# Patient Record
Sex: Female | Born: 1935 | Smoking: Never smoker
Health system: Southern US, Community
[De-identification: ages and names within clinical notes are randomized; demographics above are authoritative.]

## PROBLEM LIST (undated history)

## (undated) DIAGNOSIS — E7800 Pure hypercholesterolemia, unspecified: Secondary | ICD-10-CM

## (undated) DIAGNOSIS — J3089 Other allergic rhinitis: Secondary | ICD-10-CM

## (undated) HISTORY — PX: WRIST FRACTURE SURGERY: SHX121

## (undated) HISTORY — PX: CATARACT EXTRACTION W/ INTRAOCULAR LENS IMPLANT: SHX1309

---

## 2018-01-09 ENCOUNTER — Encounter
Admission: RE | Admit: 2018-01-09 | Discharge: 2018-01-09 | Disposition: A | Discharge status: Home / Self Care | Payer: Medicare Other | Source: Ambulatory Visit | Attending: Orthopedic Surgery | Admitting: Orthopedic Surgery

## 2018-01-09 ENCOUNTER — Other Ambulatory Visit: Payer: Self-pay

## 2018-01-09 DIAGNOSIS — E785 Hyperlipidemia, unspecified: Secondary | ICD-10-CM | POA: Diagnosis not present

## 2018-01-09 DIAGNOSIS — Z881 Allergy status to other antibiotic agents status: Secondary | ICD-10-CM | POA: Diagnosis not present

## 2018-01-09 DIAGNOSIS — M4856XA Collapsed vertebra, not elsewhere classified, lumbar region, initial encounter for fracture: Secondary | ICD-10-CM | POA: Insufficient documentation

## 2018-01-09 DIAGNOSIS — Z01818 Encounter for other preprocedural examination: Secondary | ICD-10-CM

## 2018-01-09 DIAGNOSIS — M549 Dorsalgia, unspecified: Secondary | ICD-10-CM | POA: Diagnosis not present

## 2018-01-09 DIAGNOSIS — Z7982 Long term (current) use of aspirin: Secondary | ICD-10-CM | POA: Diagnosis not present

## 2018-01-09 DIAGNOSIS — R001 Bradycardia, unspecified: Secondary | ICD-10-CM | POA: Insufficient documentation

## 2018-01-09 DIAGNOSIS — Z79899 Other long term (current) drug therapy: Secondary | ICD-10-CM | POA: Diagnosis not present

## 2018-01-09 HISTORY — DX: Other allergic rhinitis: J30.89

## 2018-01-09 HISTORY — DX: Pure hypercholesterolemia, unspecified: E78.00

## 2018-01-09 LAB — CBC
HEMATOCRIT: 39.6 % (ref 35.0–47.0)
HEMOGLOBIN: 13.1 g/dL (ref 12.0–16.0)
MCH: 32.4 pg (ref 26.0–34.0)
MCHC: 33.1 g/dL (ref 32.0–36.0)
MCV: 98.2 fL (ref 80.0–100.0)
Platelets: 236 10*3/uL (ref 150–440)
RBC: 4.04 MIL/uL (ref 3.80–5.20)
RDW: 14.3 % (ref 11.5–14.5)
WBC: 5.3 10*3/uL (ref 3.6–11.0)

## 2018-01-09 MED ORDER — CEFAZOLIN SODIUM-DEXTROSE 2-4 GM/100ML-% IV SOLN: 2.0000 g | Freq: Once | INTRAVENOUS | Status: DC

## 2018-01-09 NOTE — Patient Instructions (Signed)
Your procedure is scheduled on: 01/10/18 Tues Report to Same Day Surgery 2nd floor medical mall [facility] Entrance-take elevator on left to 2nd floor.  Check in with surgery information desk.) @ 11:00 am   Remember: Instructions that are not followed completely may result in serious medical risk, up to and including death, or upon the discretion of your surgeon and anesthesiologist your surgery may need to be rescheduled.    _x___ 1. Do not eat food after midnight the night before your procedure. You may drink clear liquids up to 2 hours before you are scheduled to arrive at the hospital for your procedure.  Do not drink clear liquids within 2 hours of your scheduled arrival to the hospital.  Clear liquids include  --Water or Apple juice without pulp  --Clear carbohydrate beverage such as ClearFast or Gatorade  --Black Coffee or Clear Tea (No milk, no creamers, do not add anything to                  the coffee or Tea Type 1 and type 2 diabetics should only drink water.   ____Ensure clear carbohydrate drink on the way to the hospital for bariatric patients  ____Ensure clear carbohydrate drink 3 hours before surgery for Dr Rutherford Nail patients if physician instructed.   No gum chewing or hard candies.     __x__ 2. No Alcohol for 24 hours before or after surgery.   __x__3. No Smoking or e-cigarettes for 24 prior to surgery.  Do not use any chewable tobacco products for at least 6 hour prior to surgery   ____  4. Bring all medications with you on the day of surgery if instructed.    __x__ 5. Notify your doctor if there is any change in your medical condition     (cold, fever, infections).    x___6. On the morning of surgery brush your teeth with toothpaste and water.  You may rinse your mouth with mouth wash if you wish.  Do not swallow any toothpaste or mouthwash.   Do not wear jewelry, make-up, hairpins, clips or nail polish.  Do not wear lotions, powders, or perfumes. You may wear  deodorant.  Do not shave 48 hours prior to surgery. Men may shave face and neck.  Do not bring valuables to the hospital.    [facility] is not responsible for any belongings or valuables.               Contacts, dentures or bridgework may not be worn into surgery.  Leave your suitcase in the car. After surgery it may be brought to your room.  For patients admitted to the hospital, discharge time is determined by your                       treatment team.  _  Patients discharged the day of surgery will not be allowed to drive home.  You will need someone to drive you home and stay with you the night of your procedure.    Please read over the following fact sheets that you were given:   [facility] Preparing for Surgery and or MRSA Information   _x___ Take anti-hypertensive listed below, cardiac, seizure, asthma,     anti-reflux and psychiatric medicines. These include:  1. HYDROcodone-acetaminophen (NORCO/VICODIN) 5-325 MG tablet if needed  2.  3.  4.  5.  6.  ____Fleets enema or Magnesium Citrate as directed.   _x___ Use CHG Soap or  sage wipes as directed on instruction sheet   ____ Use inhalers on the day of surgery and bring to hospital day of surgery  ____ Stop Metformin and Janumet 2 days prior to surgery.    ____ Take 1/2 of usual insulin dose the night before surgery and none on the morning     surgery.   _x___ Follow recommendations from Cardiologist, Pulmonologist or PCP regarding          stopping Aspirin, Coumadin, Plavix ,Eliquis, Effient, or Pradaxa, and Pletal.  X____Stop Anti-inflammatories such as Advil, Aleve, Ibuprofen, Motrin, Naproxen, Naprosyn, Goodies powders or aspirin products. OK to take Tylenol and                          Celebrex.   _x___ Stop supplements until after surgery.  But may continue Vitamin D, Vitamin B,       and multivitamin.   ____ Bring C-Pap to the hospital.

## 2018-01-09 NOTE — Pre-Procedure Instructions (Signed)
Abnormal EKG, anesthesia RN notified.

## 2018-01-09 NOTE — Pre-Procedure Instructions (Signed)
EKG REVIEWED BY DR J ADAMS AND OK TO PROCEED 

## 2018-01-10 ENCOUNTER — Encounter
Admission: RE | Disposition: A | Discharge status: Home / Self Care | Payer: Self-pay | Source: Ambulatory Visit | Attending: Orthopedic Surgery

## 2018-01-10 ENCOUNTER — Other Ambulatory Visit: Payer: Self-pay

## 2018-01-10 ENCOUNTER — Encounter: Payer: Self-pay | Admitting: *Deleted

## 2018-01-10 ENCOUNTER — Ambulatory Visit
Admission: RE | Admit: 2018-01-10 | Discharge: 2018-01-10 | Disposition: A | Discharge status: Home / Self Care | Payer: Medicare Other | Source: Ambulatory Visit | Attending: Orthopedic Surgery | Admitting: Orthopedic Surgery

## 2018-01-10 ENCOUNTER — Ambulatory Visit: Payer: Medicare Other | Admitting: Anesthesiology

## 2018-01-10 ENCOUNTER — Ambulatory Visit: Payer: Medicare Other

## 2018-01-10 DIAGNOSIS — Z419 Encounter for procedure for purposes other than remedying health state, unspecified: Secondary | ICD-10-CM

## 2018-01-10 DIAGNOSIS — Z79899 Other long term (current) drug therapy: Secondary | ICD-10-CM | POA: Insufficient documentation

## 2018-01-10 DIAGNOSIS — M4856XA Collapsed vertebra, not elsewhere classified, lumbar region, initial encounter for fracture: Secondary | ICD-10-CM | POA: Diagnosis not present

## 2018-01-10 DIAGNOSIS — M549 Dorsalgia, unspecified: Secondary | ICD-10-CM | POA: Insufficient documentation

## 2018-01-10 DIAGNOSIS — E785 Hyperlipidemia, unspecified: Secondary | ICD-10-CM | POA: Insufficient documentation

## 2018-01-10 DIAGNOSIS — Z881 Allergy status to other antibiotic agents status: Secondary | ICD-10-CM | POA: Insufficient documentation

## 2018-01-10 DIAGNOSIS — Z7982 Long term (current) use of aspirin: Secondary | ICD-10-CM | POA: Insufficient documentation

## 2018-01-10 HISTORY — PX: KYPHOPLASTY: SHX5884

## 2018-01-10 SURGERY — KYPHOPLASTY
Anesthesia: General | Site: Back

## 2018-01-10 MED ORDER — CEFAZOLIN SODIUM-DEXTROSE 2-4 GM/100ML-% IV SOLN
INTRAVENOUS | Status: AC
  Filled 2018-01-10: qty 100

## 2018-01-10 MED ORDER — KETAMINE HCL 50 MG/ML IJ SOLN
INTRAMUSCULAR | Status: AC
  Filled 2018-01-10: qty 10

## 2018-01-10 MED ORDER — SODIUM CHLORIDE 0.9 % IV SOLN: INTRAVENOUS | Status: DC

## 2018-01-10 MED ORDER — ONDANSETRON HCL 4 MG PO TABS: 4.0000 mg | ORAL_TABLET | Freq: Four times a day (QID) | ORAL | Status: DC | PRN

## 2018-01-10 MED ORDER — OXYCODONE HCL 5 MG/5ML PO SOLN: 5.0000 mg | Freq: Once | ORAL | Status: DC | PRN

## 2018-01-10 MED ORDER — LIDOCAINE HCL 1 % IJ SOLN
INTRAMUSCULAR | Status: DC | PRN
  Administered 2018-01-10: 30 mL

## 2018-01-10 MED ORDER — HYDROCODONE-ACETAMINOPHEN 5-325 MG PO TABS: 1.0000 | ORAL_TABLET | Freq: Four times a day (QID) | ORAL | 0 refills | Status: AC | PRN

## 2018-01-10 MED ORDER — FENTANYL CITRATE (PF) 100 MCG/2ML IJ SOLN
25.0000 ug | INTRAMUSCULAR | Status: DC | PRN
  Administered 2018-01-10 (×2): 25 ug via INTRAVENOUS

## 2018-01-10 MED ORDER — KETAMINE HCL 50 MG/ML IJ SOLN
INTRAMUSCULAR | Status: DC | PRN
  Administered 2018-01-10: 5 mg via INTRAMUSCULAR
  Administered 2018-01-10: 20 mg via INTRAMUSCULAR

## 2018-01-10 MED ORDER — METOCLOPRAMIDE HCL 5 MG/ML IJ SOLN: 5.0000 mg | Freq: Three times a day (TID) | INTRAMUSCULAR | Status: DC | PRN

## 2018-01-10 MED ORDER — FAMOTIDINE 20 MG PO TABS
ORAL_TABLET | ORAL | Status: AC
  Administered 2018-01-10: 20 mg via ORAL
  Filled 2018-01-10: qty 1

## 2018-01-10 MED ORDER — ONDANSETRON HCL 4 MG/2ML IJ SOLN: 4.0000 mg | Freq: Once | INTRAMUSCULAR | Status: DC | PRN

## 2018-01-10 MED ORDER — MIDAZOLAM HCL 2 MG/2ML IJ SOLN
INTRAMUSCULAR | Status: AC
  Filled 2018-01-10: qty 2

## 2018-01-10 MED ORDER — ONDANSETRON HCL 4 MG/2ML IJ SOLN: 4.0000 mg | Freq: Four times a day (QID) | INTRAMUSCULAR | Status: DC | PRN

## 2018-01-10 MED ORDER — IOPAMIDOL (ISOVUE-M 200) INJECTION 41%
INTRAMUSCULAR | Status: DC | PRN
  Administered 2018-01-10: 20 mL

## 2018-01-10 MED ORDER — MEPERIDINE HCL 50 MG/ML IJ SOLN: 6.2500 mg | INTRAMUSCULAR | Status: DC | PRN

## 2018-01-10 MED ORDER — FAMOTIDINE 20 MG PO TABS
20.0000 mg | ORAL_TABLET | Freq: Once | ORAL | Status: AC
  Administered 2018-01-10: 20 mg via ORAL

## 2018-01-10 MED ORDER — PROPOFOL 10 MG/ML IV BOLUS
INTRAVENOUS | Status: AC
  Filled 2018-01-10: qty 20

## 2018-01-10 MED ORDER — OXYCODONE HCL 5 MG PO TABS: 5.0000 mg | ORAL_TABLET | Freq: Once | ORAL | Status: DC | PRN

## 2018-01-10 MED ORDER — BUPIVACAINE-EPINEPHRINE (PF) 0.5% -1:200000 IJ SOLN
INTRAMUSCULAR | Status: DC | PRN
  Administered 2018-01-10: 20 mL

## 2018-01-10 MED ORDER — FENTANYL CITRATE (PF) 100 MCG/2ML IJ SOLN
INTRAMUSCULAR | Status: AC
  Administered 2018-01-10: 25 ug via INTRAVENOUS
  Filled 2018-01-10: qty 2

## 2018-01-10 MED ORDER — METOCLOPRAMIDE HCL 10 MG PO TABS: 5.0000 mg | ORAL_TABLET | Freq: Three times a day (TID) | ORAL | Status: DC | PRN

## 2018-01-10 MED ORDER — HYDROCODONE-ACETAMINOPHEN 5-325 MG PO TABS: 1.0000 | ORAL_TABLET | ORAL | Status: DC | PRN

## 2018-01-10 MED ORDER — LACTATED RINGERS IV SOLN
INTRAVENOUS | Status: DC
  Administered 2018-01-10: 12:00:00 via INTRAVENOUS

## 2018-01-10 MED ORDER — MIDAZOLAM HCL 2 MG/2ML IJ SOLN
INTRAMUSCULAR | Status: DC | PRN
  Administered 2018-01-10 (×2): 0.5 mg via INTRAVENOUS

## 2018-01-10 SURGICAL SUPPLY — 18 items
CEMENT KYPHON CX01A KIT/MIXER (Cement) ×3 IMPLANT
DERMABOND ADVANCED (GAUZE/BANDAGES/DRESSINGS) ×2
DERMABOND ADVANCED .7 DNX12 (GAUZE/BANDAGES/DRESSINGS) ×1 IMPLANT
DEVICE BIOPSY BONE KYPH (INSTRUMENTS) ×2 IMPLANT
DEVICE BIOPSY BONE KYPHX (INSTRUMENTS) ×1 IMPLANT
DRAPE C-ARM XRAY 36X54 (DRAPES) ×3 IMPLANT
DURAPREP 26ML APPLICATOR (WOUND CARE) ×3 IMPLANT
GLOVE SURG SYN 9.0  PF PI (GLOVE) ×2
GLOVE SURG SYN 9.0 PF PI (GLOVE) ×1 IMPLANT
GOWN SRG 2XL LVL 4 RGLN SLV (GOWNS) ×1 IMPLANT
GOWN STRL NON-REIN 2XL LVL4 (GOWNS) ×2
GOWN STRL REUS W/ TWL LRG LVL3 (GOWN DISPOSABLE) ×1 IMPLANT
GOWN STRL REUS W/TWL LRG LVL3 (GOWN DISPOSABLE) ×2
PACK KYPHOPLASTY (MISCELLANEOUS) ×3 IMPLANT
STRAP SAFETY 5IN WIDE (MISCELLANEOUS) ×3 IMPLANT
TRAY KYPHOPAK 15/2 EXPRESS (KITS) ×2 IMPLANT
TRAY KYPHOPAK 15/3 EXPRESS 1ST (MISCELLANEOUS) ×1 IMPLANT
TRAY KYPHOPAK 20/3 EXPRESS 1ST (MISCELLANEOUS) ×1 IMPLANT

## 2018-01-10 NOTE — Anesthesia Post-op Follow-up Note (Signed)
Anesthesia QCDR form completed.        

## 2018-01-10 NOTE — Discharge Instructions (Addendum)
Take it easy today and tomorrow and resume more normal activities on Thursday as tolerated.  Remove Band-Aids on Thursday then okay to shower.  Pain medicine as directed    AMBULATORY SURGERY  DISCHARGE INSTRUCTIONS   1) The drugs that you were given will stay in your system until tomorrow so for the next 24 hours you should not:  A) Drive an automobile B) Make any legal decisions C) Drink any alcoholic beverage   2) You may resume regular meals tomorrow.  Today it is better to start with liquids and gradually work up to solid foods.  You may eat anything you prefer, but it is better to start with liquids, then soup and crackers, and gradually work up to solid foods.   3) Please notify your doctor immediately if you have any unusual bleeding, trouble breathing, redness and pain at the surgery site, drainage, fever, or pain not relieved by medication.    4) Additional Instructions:        Please contact your physician with any problems or Same Day Surgery at [phone], Monday through Friday 6 am to 4 pm, or [place] at [facility] number at [phone].

## 2018-01-10 NOTE — Anesthesia Postprocedure Evaluation (Signed)
Anesthesia Post Note  Patient: Carrie Rich  Procedure(s) Performed: Clovia Cuff (N/A Back)  Patient location during evaluation: PACU Anesthesia Type: General Level of consciousness: awake and alert and oriented Pain management: pain level controlled Vital Signs Assessment: post-procedure vital signs reviewed and stable Respiratory status: spontaneous breathing, nonlabored ventilation and respiratory function stable Cardiovascular status: blood pressure returned to baseline and stable Postop Assessment: no signs of nausea or vomiting Anesthetic complications: no     Last Vitals:  Vitals:   01/10/18 1332 01/10/18 1341  BP: 97/79 120/66  Pulse: 61   Resp: (!) 26   Temp:    SpO2: 100%     Last Pain:  Vitals:   01/10/18 1341  TempSrc:   PainSc: Asleep                 Kirstein Baxley

## 2018-01-10 NOTE — OR Nursing (Signed)
Discussed discharge instructions with son and grandaughter and pt. All voice understanding.

## 2018-01-10 NOTE — H&P (Signed)
Reviewed paper H+P, will be scanned into chart. No changes noted.  

## 2018-01-10 NOTE — Op Note (Signed)
01/10/2018  1:04 PM  PATIENT:  Carrie Rich  82 y.o. female  PRE-OPERATIVE DIAGNOSIS:  COMPRESSION FRACTURE OF L4 VERTEBRA  POST-OPERATIVE DIAGNOSIS:  COMPRESSION FRACTURE OF L4 VERTEBRA  PROCEDURE:  Procedure(s): KYPHOPLASTY-L4 (N/A)  SURGEON: Laurene Footman, MD  ASSISTANTS: None  ANESTHESIA:   local and MAC  EBL:  Total I/O In: 300 [I.V.:300] Out: 5 [Blood:5]  BLOOD ADMINISTERED:none  DRAINS: none   LOCAL MEDICATIONS USED:  MARCAINE    and XYLOCAINE   SPECIMEN:  Source of Specimen:  L4 vertebral body  DISPOSITION OF SPECIMEN:  PATHOLOGY  COUNTS:  YES  TOURNIQUET:  * No tourniquets in log *  IMPLANTS: Bone cement  DICTATION: .Dragon Dictation patient was brought to the operating room and after adequate sedation was given the patient was placed prone.  C arm was brought in in good visualization in both AP and lateral projections was obtained.  After patient identification and timeout procedures were completed 5 cc of 1% Xylocaine was infiltrated in the area of the planned incision on either side of L4.  After prepping and draping in the usual sterile fashion, repeat timeout procedure carried out.  Spinal needle was then brought to the lateral border of the pedicle at L4 and local anesthetic infiltrated from the tract from the bone to the skin with a 50-50 mix of 1% Xylocaine half percent Sensorcaine with epinephrine 10 cc of each on each side.  After allowing this to set a small incision was made first on the right than the left with the trocar advanced along the inferior border of the pedicle to stay closer to the inferior endplate and avoid penetration of the superior endplate.  After biopsy was obtained drilling was carried out followed by balloon inflation first on the right and then left with about 7 cc utilized.  After the cement was the appropriate consistency the balloon on the right was let down and inflation left up on the left side to maintain the partial correction  of the deformity.  After filling the right side and getting some of the cement to crossing the midline the left balloon was released and both sides were filled with about 7-1/2 cc of cement placed maintaining partial correction of the deformity and getting very good fill without extravasation.  After the cement had set both trochars were removed and Dermabond was used for the skin incisions followed by Band-Aids  PLAN OF CARE: Discharge to home after PACU  PATIENT DISPOSITION:  PACU - hemodynamically stable.

## 2018-01-10 NOTE — Transfer of Care (Signed)
Immediate Anesthesia Transfer of Care Note  Patient: Carrie Rich  Procedure(s) Performed: Clovia Cuff (N/A Back)  Patient Location: PACU  Anesthesia Type:General  Level of Consciousness: awake, alert  and oriented  Airway & Oxygen Therapy: Patient Spontanous Breathing  Post-op Assessment: Report given to RN and Post -op Vital signs reviewed and stable  Post vital signs: Reviewed and stable  Last Vitals:  Vitals Value Taken Time  BP 140/71 01/10/2018  1:02 PM  Temp 36.2 C 01/10/2018  1:00 PM  Pulse 65 01/10/2018  1:02 PM  Resp 12 01/10/2018  1:02 PM  SpO2 100 % 01/10/2018  1:02 PM  Vitals shown include unvalidated device data.  Last Pain:  Vitals:   01/10/18 1054  TempSrc: Oral  PainSc: 5          Complications: No apparent anesthesia complications

## 2018-01-10 NOTE — Anesthesia Preprocedure Evaluation (Signed)
Anesthesia Evaluation  Patient identified by MRN, date of birth, ID band Patient awake    Reviewed: Allergy & Precautions, NPO status , Patient's Chart, lab work & pertinent test results  History of Anesthesia Complications Negative for: history of anesthetic complications  Airway Mallampati: II  TM Distance: >3 FB Neck ROM: Full    Dental  (+) Upper Dentures, Partial Lower   Pulmonary neg pulmonary ROS, neg sleep apnea, neg COPD,    breath sounds clear to auscultation- rhonchi (-) wheezing      Cardiovascular Exercise Tolerance: Good (-) hypertension(-) CAD and (-) Past MI  Rhythm:Regular Rate:Normal - Systolic murmurs and - Diastolic murmurs    Neuro/Psych negative neurological ROS  negative psych ROS   GI/Hepatic negative GI ROS, Neg liver ROS,   Endo/Other  negative endocrine ROSneg diabetes  Renal/GU negative Renal ROS     Musculoskeletal negative musculoskeletal ROS (+)   Abdominal (+) - obese,   Peds  Hematology negative hematology ROS (+)   Anesthesia Other Findings Past Medical History: No date: Elevated cholesterol No date: Environmental and seasonal allergies   Reproductive/Obstetrics                             Anesthesia Physical Anesthesia Plan  ASA: II  Anesthesia Plan: General   Post-op Pain Management:    Induction: Intravenous  PONV Risk Score and Plan: 2 and Propofol infusion  Airway Management Planned: Natural Airway  Additional Equipment:   Intra-op Plan:   Post-operative Plan:   Informed Consent: I have reviewed the patients History and Physical, chart, labs and discussed the procedure including the risks, benefits and alternatives for the proposed anesthesia with the patient or authorized representative who has indicated his/her understanding and acceptance.   Dental advisory given  Plan Discussed with: CRNA and Anesthesiologist  Anesthesia Plan  Comments:         Anesthesia Quick Evaluation

## 2018-01-11 ENCOUNTER — Encounter: Payer: Self-pay | Admitting: Orthopedic Surgery

## 2018-01-11 LAB — SURGICAL PATHOLOGY

## 2018-01-23 NOTE — H&P (Signed)
Progress Notes - documented in this encounter  Table of Contents for Progress Notes  Marlena Clipper, MD - 01/04/2018 12:00 PM EDT  Kelvin Cellar, CMA - 01/04/2018 12:00 PM EDT    Marlena Clipper, MD - 01/04/2018 12:00 PM EDT Formatting of this note might be different from the original.  Chief Complaint  Patient presents with  . Back Pain - Lumbar Area  L4 Compression Fracture.   History of the Present Illness: Carrie Rich is a 82 y.o. female here for evaluation of an L4 compression fracture. The patient had a prior MRI at [facility] on 11/26/2017. This showed extensive edema of L4 with significant compression of the superior endplate.   The patient reports that she is not sure of what caused her fracture. She explains she had difficulty standing up straight one morning due to pain. She rates her current pain at 5/10. She has more pain with walking. She has reduced her activities due to pain.   She has history of a prior compression fracture at T10. She has been followed by Dr. Lois Huxley in the past concerning her back.  I have reviewed past medical, surgical, social and family history, and allergies as documented in the EMR.  Past Medical History: Past Medical History:  Diagnosis Date  . Bigeminy  . Hyperlipidemia  . Osteoporosis   Past Surgical History: Past Surgical History:  Procedure Laterality Date  . COLONOSCOPY  . pr remv cataract extracap,insert lens (Left, 07/29/2014); and pr remv cataract extracap,insert lens (Right, 08/12/2014)  . Wrist fracture surgery (Right)   Past Family History: Family History  Problem Relation Age of Onset  . Macular degeneration Mother  . Atrial fibrillation (Abnormal heart rhythm sometimes requiring treatment with blood thinners) Sister  . Glaucoma Sister  . Osteoporosis (Thinning of bones) Sister  . Macular degeneration Brother  . Breast cancer Paternal Aunt   Medications: Current Outpatient Medications Ordered in Epic    Medication Sig Dispense Refill  . alendronate (FOSAMAX) 70 MG tablet Take 1 tablet by mouth every 7 (seven) days  . aspirin 81 MG EC tablet Take 1 tablet by mouth once daily  . biotin 1 mg tablet Take 1 mg by mouth once daily  . cal-D3-mag11-zinc-cop-mang-bor 600 mg calcium- 800 unit-50 mg Tab Take 1 tablet by mouth once daily  . calcium carbonate-vitamin D3 600 mg calcium- 200 unit Cap Take 1 tablet by mouth once daily  . cyclobenzaprine (FLEXERIL) 5 MG tablet Take 1 tablet by mouth 2 (two) times daily  . HYDROcodone-acetaminophen (NORCO) 5-325 mg tablet Take 1 tablet by mouth every 6 (six) hours as needed  . methocarbamol (ROBAXIN) 750 MG tablet Take 1 tablet by mouth every 6 (six) hours as needed  . multivit-min-FA-lycopen-lutein 0.4-300-250 mg-mcg-mcg Tab Take 1 tablet by mouth once daily  . omega-3 fatty acids-fish oil 360-1,200 mg Cap Take 1 tablet by mouth 2 (two) times daily  . red yeast rice 600 mg Cap Take 1 tablet by mouth 2 (two) times daily   No current Epic-ordered facility-administered medications on file.   Allergies: Allergies  Allergen Reactions  . Estrogens Other (See Comments)  Other reaction(s): OTHER ; premarin ; jaundice.  . Ciprofloxacin Rash    Body mass index is 23.74 kg/m.  Review of Systems: A comprehensive 14 point ROS was performed, reviewed, and the pertinent orthopaedic findings are documented in the HPI.  Vitals:  01/04/18 1217  BP: 126/74   General Physical Examination:  General/Constitutional: No apparent distress: well-nourished  and well developed. Eyes: Pupils equal, round with synchronous movement. Lungs: Clear to auscultation HEENT: Normal Vascular: No edema, swelling or tenderness, except as noted in detailed exam. Cardiac: Heart rate and rhythm is regular. Integumentary: No impressive skin lesions present, except as noted in detailed exam. Neuro/Psych: Normal mood and affect, oriented to person, place and time.  Musculoskeletal  Examination: On exam, the patient is tender to percussion over L4.   Lungs are clear. Heart rate and rhythm is normal. HEENT is normal. Full upper denture and partial lower denture.  Radiographs: Her prior imaging was reviewed today as noted above. We also reviewed her recent MRI she brought with her on disc today.  AP, lateral x-rays of the lumbar spine were ordered and personally reviewed today. These show the L4 compression fracture which has significant compression. There is approximately 80% compression consistent with progression from her prior lumbar MRI.  Assessment: ICD-10-CM ICD-9-CM  1. Compression fracture of L4 vertebra, initial encounter (CMS-HCC) S32.040A 805.4   Plan: The patient has clinical findings of an LR4 compression fracture over 1.5 months old with persisting severe pain.   We reviewed her condition and treatment course in detail today. I recommend kyphoplasty of L4 at this time due to the progression, her level of pain and her decreased activity level after the fracture. We discussed the procedure in detail today. She notes she is aware of the procedure due to her sister having multiple kyphoplasty procedures in the past. She understands the post-op course.   Surgical Risks:  The nature of the condition and the proposed procedure has been reviewed in detail with the patient. Surgical versus non-surgical options and prognosis for recovery have been reviewed and the inherent risks and benefits of each have been discussed including the risks of infection, bleeding, injury to nerves / blood vessels / tendons, incomplete relief of symptoms, persisting pain and / or stiffness, loss of function, complex regional pain syndrome, failure of procedure, as appropriate.  Teeth: Full upper denture and partial lower denture  Scribe Attestation: I, Georgia Lopes, am acting as scribe for [employer], MD

## 2019-10-29 ENCOUNTER — Other Ambulatory Visit: Payer: Self-pay | Admitting: Physical Medicine and Rehabilitation

## 2019-10-29 DIAGNOSIS — M5416 Radiculopathy, lumbar region: Secondary | ICD-10-CM

## 2019-11-13 ENCOUNTER — Other Ambulatory Visit: Payer: Self-pay

## 2019-11-13 ENCOUNTER — Ambulatory Visit
Admission: RE | Admit: 2019-11-13 | Discharge: 2019-11-13 | Disposition: A | Discharge status: Home / Self Care | Payer: Medicare Other | Source: Ambulatory Visit | Attending: Physical Medicine and Rehabilitation | Admitting: Physical Medicine and Rehabilitation

## 2019-11-13 DIAGNOSIS — M5416 Radiculopathy, lumbar region: Secondary | ICD-10-CM | POA: Diagnosis present

## 2021-11-25 NOTE — Progress Notes (Unsigned)
Referring Physician:  Ross Marcus, NP [address] [place],  [place] [address]  Primary Physician:  Pcp, No  History of Present Illness: 11/25/2021 [name] is here today with a chief complaint of low back pain that goes into the left posterior leg that stops at the knee.   She began having trouble several years ago.  She had a fall and underwent a kyphoplasty in 2019.  Over the past 3 years, she has been having worsening pain in the morning him discomfort in her legs particularly after she sits or stands for long period of time.  She still able to stand for an hour at a time.  She wears a back brace at times, which helps.  She is able to walk, but needs a cart when she goes to a store.  She feels off balance at times.  She uses a cane and has for the past 4 to 6 months.   Bowel/Bladder Dysfunction: none  Conservative measures:  Physical therapy: has not participated in within the past year Multimodal medical therapy including regular antiinflammatories: tylenol Injections: has not had any epidural steroid injections 01/10/18: Kyphoplasty   Past Surgery:  denies  Carrie Rich has no symptoms of cervical myelopathy.  The symptoms are causing a significant impact on the patient's life.   Review of Systems:  A 10 point review of systems is negative, except for the pertinent positives and negatives detailed in the HPI.  Past Medical History: Past Medical History:  Diagnosis Date   Elevated cholesterol    Environmental and seasonal allergies     Past Surgical History: Past Surgical History:  Procedure Laterality Date   CATARACT EXTRACTION W/ INTRAOCULAR LENS IMPLANT     KYPHOPLASTY N/A 01/10/2018   Procedure: [id number];  Surgeon: Kennedy Bucker, MD;  Location: [facility] ORS;  Service: Orthopedics;  Laterality: N/A;   WRIST FRACTURE SURGERY      Allergies: Allergies as of 11/26/2021 - Review Complete 01/10/2018  Allergen Reaction Noted   Ciprofloxacin Rash  01/06/2018    Medications: No outpatient medications have been marked as taking for the 11/26/21 encounter (Appointment) with Venetia Night, MD.    Social History: Social History   Tobacco Use   Smoking status: Never   Smokeless tobacco: Never  Vaping Use   Vaping Use: Never used  Substance Use Topics   Alcohol use: Never   Drug use: Never    Family Medical History: No family history on file.  Physical Examination: There were no vitals filed for this visit.  General: Patient is well developed, well nourished, calm, collected, and in no apparent distress. Attention to examination is appropriate.  Neck:   Supple.  Full range of motion.  Respiratory: Patient is breathing without any difficulty.   NEUROLOGICAL:     Awake, alert, oriented to person, place, and time.  Speech is clear and fluent. Fund of knowledge is appropriate.   Cranial Nerves: Pupils equal round and reactive to light.  Facial tone is symmetric.  Facial sensation is symmetric. Shoulder shrug is symmetric. Tongue protrusion is midline.  There is no pronator drift.  ROM of spine: full.    Strength: Side Biceps Triceps Deltoid Interossei Grip Wrist Ext. Wrist Flex.  R 5 5 5 5 5 5 5   L 5 5 5 5 5 5 5    Side Iliopsoas Quads Hamstring PF DF EHL  R 5 5 5 5 5 5   L 5 5 5 5 5  5  Reflexes are 1+ and symmetric at the biceps, triceps, brachioradialis, patella and achilles.   Hoffman's is absent.  Clonus is not present.  Toes are down-going.  Bilateral upper and lower extremity sensation is intact to light touch.    No evidence of dysmetria noted.  Gait is slowed and requires a cane.     Medical Decision Making  Imaging: MRI L spine 10/27/21 L3-L4: Retropulsion of bony fragment of L4 into central spinal canal and severe hypertrophy of ligamentum flavum leading to severe spinal canal narrowing bilaterally with impingement of multiple nerve roots of the cauda equina. There is also moderate bilateral  neural foraminal narrowing.   L4-L5: Diffuse disc bulge and and severe hypertrophy of ligamentum flavum leading to moderate spinal canal narrowing with moderate bilateral neural foraminal narrowing.   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   Chronic degenerative changes.  I have personally reviewed the images and agree with the above interpretation.  Assessment and Plan: [name] is a pleasant 86 y.o. female with a healed L4 burst fracture status post kyphoplasty.  She has severe stenosis at L3-4 and moderate lateral recess stenosis at L4-5.  Her symptoms are primarily back related though she does have some symptoms of neurogenic claudication.  I will send her for physical therapy.  She will touch base with her injection specialist to consider an epidural.  I think this is appropriate.  I will see her back in 8 weeks.  If she is not better in that time, we will discuss the utility of a lumbar decompression.  I do have some concern that lumbar decompression may not address her primary concern which seems to be pain related to her chronic fracture.   I spent a total of 30 minutes in face-to-face and non-face-to-face activities related to this patient's care today.  Thank you for involving me in the care of this patient.      Deric Bocock K. Myer Haff MD, [facility] Neurosurgery

## 2021-11-26 ENCOUNTER — Ambulatory Visit: Payer: Medicare Other | Admitting: Neurosurgery

## 2021-11-26 DIAGNOSIS — M48062 Spinal stenosis, lumbar region with neurogenic claudication: Secondary | ICD-10-CM | POA: Diagnosis not present

## 2021-11-26 DIAGNOSIS — S32041S Stable burst fracture of fourth lumbar vertebra, sequela: Secondary | ICD-10-CM

## 2021-12-28 ENCOUNTER — Telehealth: Payer: Self-pay

## 2021-12-28 DIAGNOSIS — Z049 Encounter for examination and observation for unspecified reason: Secondary | ICD-10-CM

## 2021-12-28 NOTE — Telephone Encounter (Signed)
I have been trying to get her images from [facility] through [place] since 11/13/21 without a response. I have sent multiple requests in [place] and I have called. Raquel Sarna has also sent 2-3 powershare requests without a response. She will need to get a CD from [facility] and have it mailed to [place] or bring it to her next appointment.  I need the following images on the CD:  10/27/21: MRI lumbar at [place]  06/02/21: xray lumbar at [place]  08/08/20: CT abd pel at [place]    Thanks

## 2021-12-28 NOTE — Telephone Encounter (Signed)
She will call and request her images on a CD to be mailed to our office.

## 2021-12-29 NOTE — Telephone Encounter (Signed)
There is a fax from [facility] with her imaging reports. Did you need those or just actual images?

## 2021-12-29 NOTE — Telephone Encounter (Signed)
We do not need the reports, they are in care everywhere. Just need the CD's.

## 2022-01-06 ENCOUNTER — Ambulatory Visit
Admission: RE | Admit: 2022-01-06 | Discharge: 2022-01-06 | Disposition: A | Discharge status: Home / Self Care | Payer: Self-pay | Source: Ambulatory Visit | Attending: Neurosurgery | Admitting: Neurosurgery

## 2022-01-06 ENCOUNTER — Other Ambulatory Visit: Payer: Self-pay

## 2022-01-06 DIAGNOSIS — Z049 Encounter for examination and observation for unspecified reason: Secondary | ICD-10-CM

## 2022-01-18 NOTE — Telephone Encounter (Signed)
[facility] will push MRI through [place] [facility] now.

## 2022-01-20 NOTE — Telephone Encounter (Signed)
Still haven't received it. Sent another powershare request

## 2022-01-27 NOTE — Telephone Encounter (Signed)
Sent another powershare request

## 2022-01-28 ENCOUNTER — Ambulatory Visit: Payer: Medicare Other | Admitting: Neurosurgery

## 2022-02-03 NOTE — Telephone Encounter (Signed)
Sent another request

## 2022-02-22 ENCOUNTER — Ambulatory Visit
Admission: RE | Admit: 2022-02-22 | Discharge: 2022-02-22 | Disposition: A | Discharge status: Home / Self Care | Payer: Self-pay | Source: Ambulatory Visit | Attending: Neurosurgery | Admitting: Neurosurgery

## 2022-02-22 DIAGNOSIS — Z049 Encounter for examination and observation for unspecified reason: Secondary | ICD-10-CM

## 2022-05-05 IMAGING — MR MR LUMBAR SPINE W/O CM
5 series · 30 of 48 positions shown · non-contrast
Comparison: Lumbar radiography 11/21/2018. report from MRI study
11/26/2017.

CLINICAL DATA: Kyphoplasty 7754. Fell 1 year ago with right-sided
back pain since then.

EXAM:
MRI LUMBAR SPINE WITHOUT CONTRAST
TECHNIQUE: Multiplanar, multisequence MR imaging of the lumbar spine was
performed. No intravenous contrast was administered.

[Series 5: T2 · sagittal · 4.0mm · 0.81mm/px · 6 of 17 slices shown (1 of 2)]
[im 1/17]
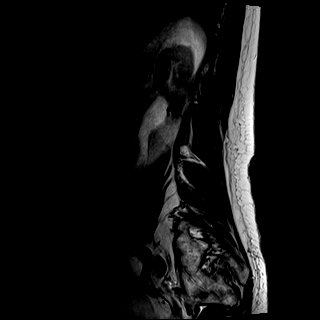
[im 4/17]
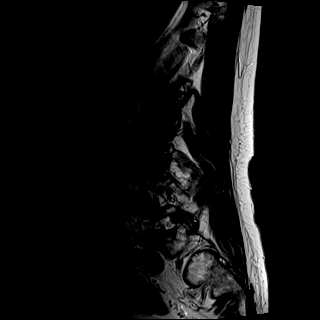
[im 7/17]
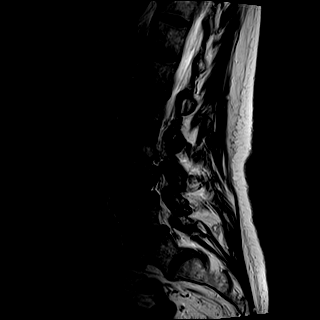
[im 10/17]
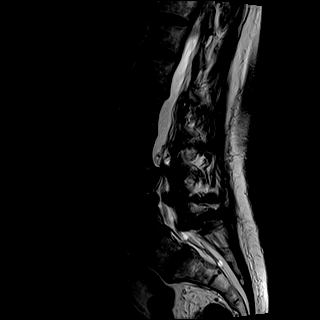
[im 13/17]
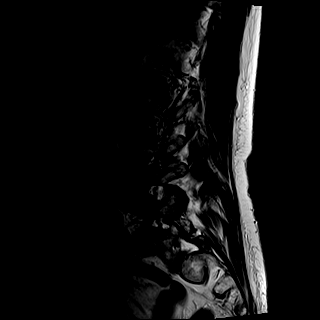
[im 17/17]
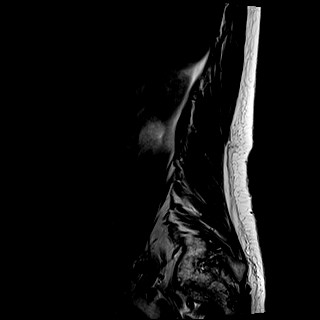

[Series 6: T1 · sagittal · 4.0mm · 0.81mm/px · 7 of 17 slices shown (1 of 2)]
[im 1/17]
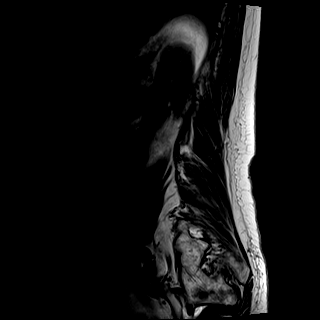
[im 3/17]
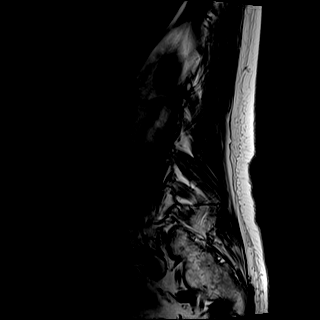
[im 6/17]
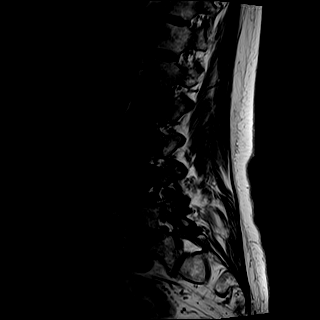
[im 9/17]
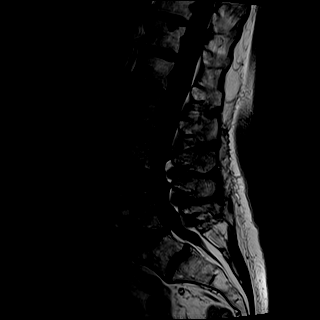
[im 11/17]
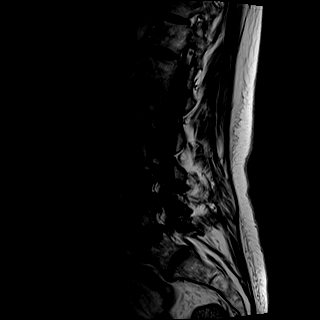
[im 14/17]
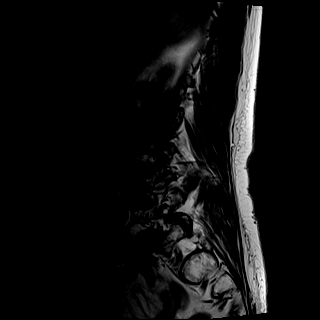
[im 17/17]
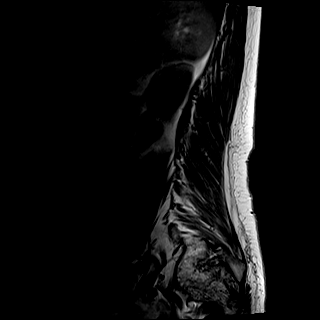

[Series 7: STIR · sagittal · 4.0mm · 0.41mm/px · 1 of 17 slices shown]
[im 1/17]
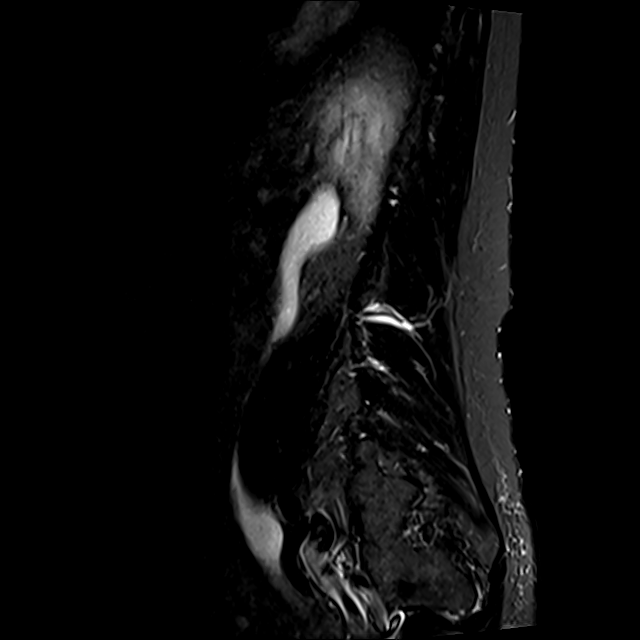

[Series 8: T1 · axial · 4.0mm · 0.39mm/px · z∈[-139,+64]mm · 8 of 34 slices shown (2 of 2)]
[im 1/34]
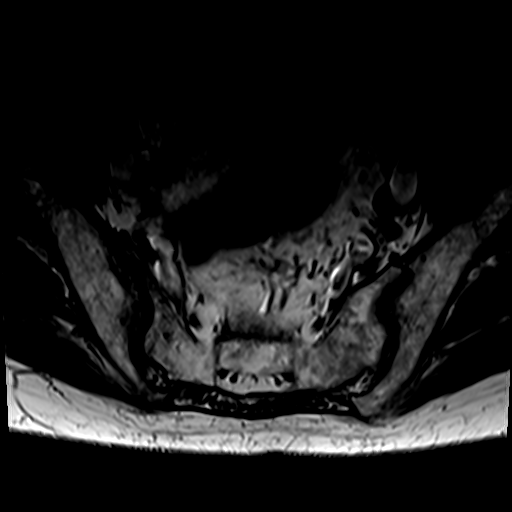
[im 6/34]
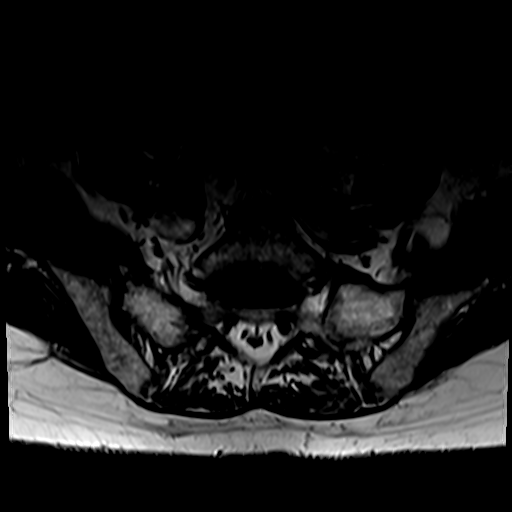
[im 11/34]
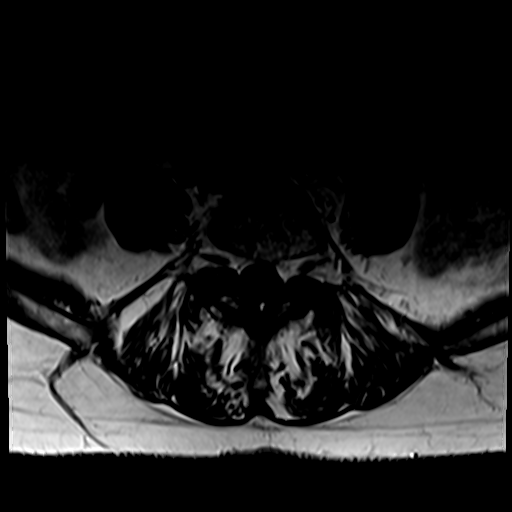
[im 16/34]
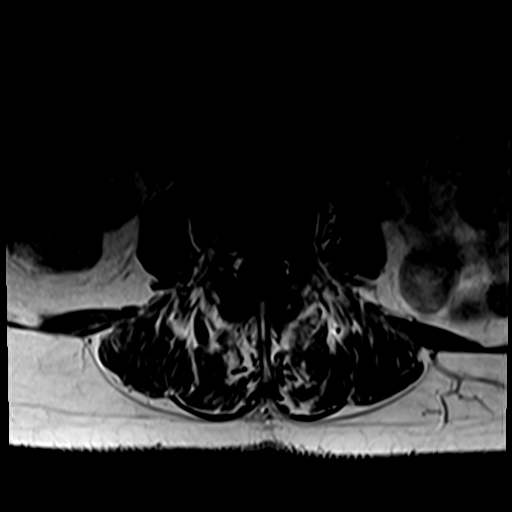
[im 18/34]
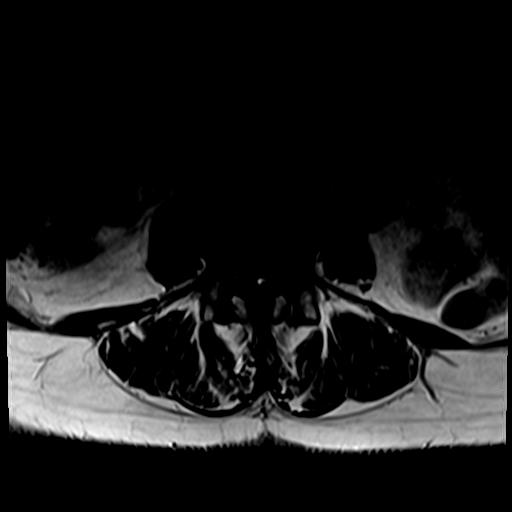
[im 23/34]
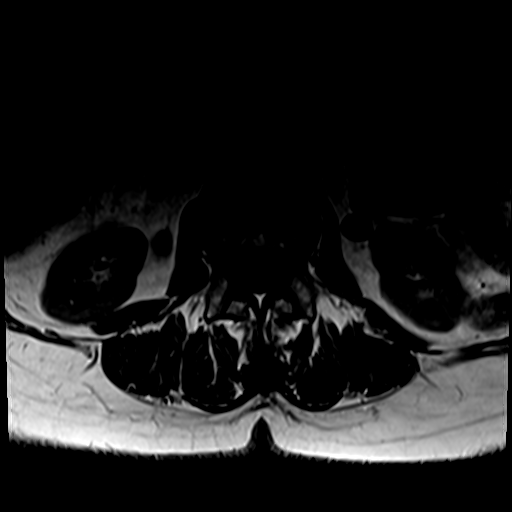
[im 28/34]
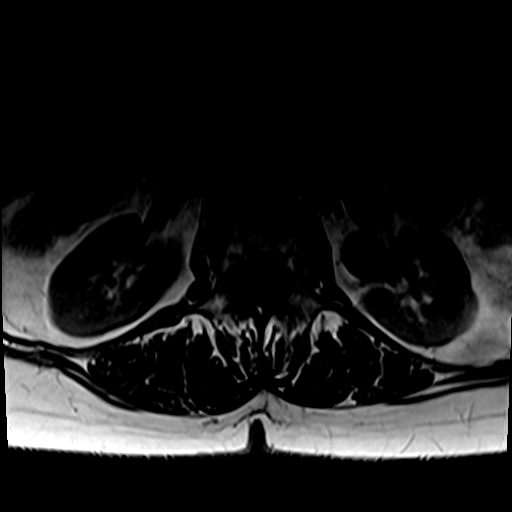
[im 34/34]
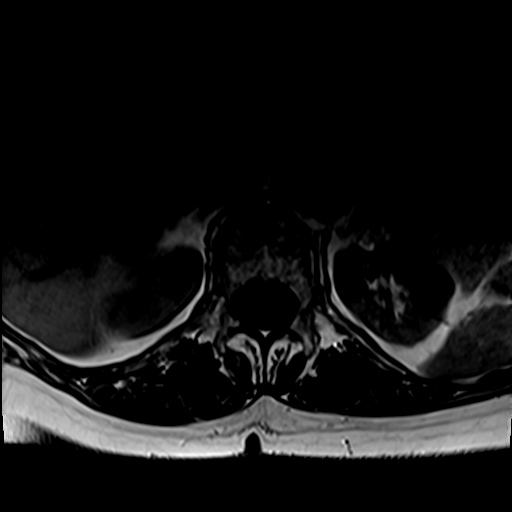

[Series 9: T2 · axial · 4.0mm · 0.78mm/px · z∈[-139,+64]mm · 8 of 34 slices shown (2 of 2)]
[im 1/34]
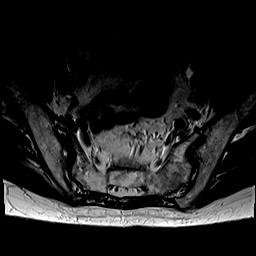
[im 6/34]
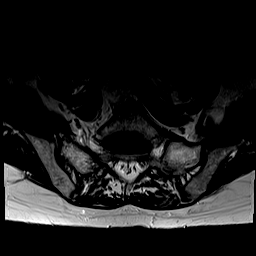
[im 11/34]
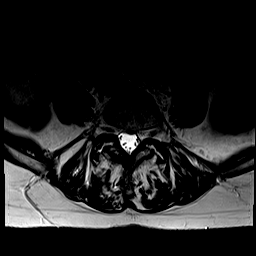
[im 16/34]
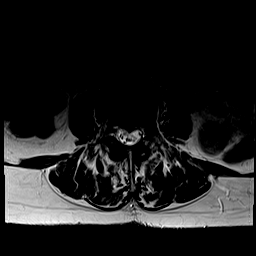
[im 18/34]
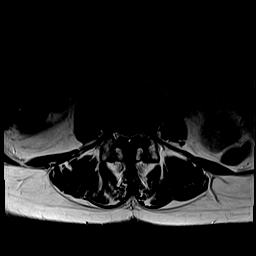
[im 23/34]
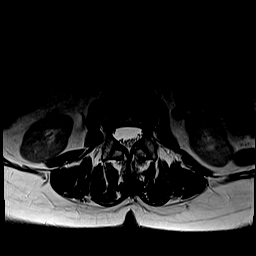
[im 28/34]
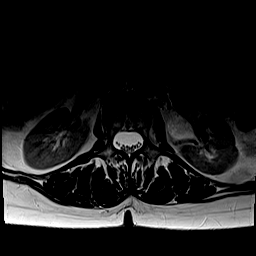
[im 34/34]
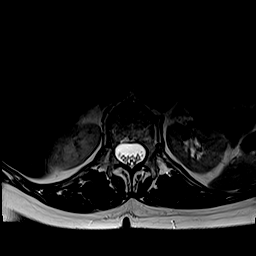

[30 of 48 positions shown; findings below may reference images not displayed]

FINDINGS: Segmentation:  S1 has some transitional features.

Alignment: 2 mm degenerative anterolisthesis L4-5 and L5-S1.
Exaggerated lower lumbar lordosis.

Vertebrae: Old healed augmented fracture at L4 without residual
edema or evidence of progression. Posterior bowing of the
posterosuperior margin of the vertebral body measures 8 mm. No other
regional fracture.

Conus medullaris and cauda equina: Conus extends to the L1 level.
Conus and cauda equina appear normal.

Paraspinal and other soft tissues: Negative

Disc levels:

Mild non-compressive disc bulges T11-12 through L2-3.

L3-4: Severe spinal stenosis. Posterior bowing of the
posterosuperior margin of the L4 vertebral body by 8 mm. Pronounced
ligamentous hypertrophy. Neural compression likely at this level.
Bilateral foraminal stenosis as well.

L4-5: Bulging of the disc. Facet and ligamentous hypertrophy with 2
mm of anterolisthesis. Mild stenosis of both lateral recesses and
neural foramina.

L5-S1: Facet arthropathy with 2 mm of anterolisthesis. Bulging of
the disc. Stenosis of both lateral recesses that could cause neural
compression. Mild bilateral foraminal narrowing. Edema of the facet
joints likely correlated with low back pain.

S1-2: Transitional level. Sufficient patency of the canal and
foramina.
IMPRESSION: Old augmented fracture at L4 without progression or edema. No new
regional fracture.

Severe stenosis at the L3-4 level due to 8 mm posterior bowing of
the posterosuperior margin of the L4 vertebral body in combination
with pronounced ligamentous hypertrophy. Bilateral foraminal
stenosis as well. Neural compression could occur on either or both
sides at this level.

L4-5: Facet osteoarthritis with 2 mm of anterolisthesis. Bulging of
the disc. Mild stenosis of both lateral recesses and neural
foramina.

L5-S1: Facet osteoarthritis with 2 mm of anterolisthesis. Mild
bulging of the disc. Bilateral lateral recess stenosis that could be
symptomatic. Facet arthropathy is associated with edema and could be
a cause of low back.
